# Patient Record
Sex: Male | Born: 1980 | Race: White | Hispanic: No | Marital: Single | Smoking: Current every day smoker
Health system: Southern US, Community
[De-identification: ages and names within clinical notes are randomized; demographics above are authoritative.]

---

## 2016-08-09 ENCOUNTER — Encounter (HOSPITAL_BASED_OUTPATIENT_CLINIC_OR_DEPARTMENT_OTHER): Payer: Self-pay | Admitting: *Deleted

## 2016-08-09 ENCOUNTER — Emergency Department (HOSPITAL_BASED_OUTPATIENT_CLINIC_OR_DEPARTMENT_OTHER): Payer: Self-pay

## 2016-08-09 ENCOUNTER — Emergency Department (HOSPITAL_BASED_OUTPATIENT_CLINIC_OR_DEPARTMENT_OTHER)
Admission: EM | Admit: 2016-08-09 | Discharge: 2016-08-09 | Disposition: A | Discharge status: Home / Self Care | Payer: Self-pay | Attending: Emergency Medicine | Admitting: Emergency Medicine

## 2016-08-09 DIAGNOSIS — K0889 Other specified disorders of teeth and supporting structures: Secondary | ICD-10-CM | POA: Insufficient documentation

## 2016-08-09 DIAGNOSIS — G8929 Other chronic pain: Secondary | ICD-10-CM | POA: Insufficient documentation

## 2016-08-09 DIAGNOSIS — R059 Cough, unspecified: Secondary | ICD-10-CM

## 2016-08-09 DIAGNOSIS — R109 Unspecified abdominal pain: Secondary | ICD-10-CM | POA: Insufficient documentation

## 2016-08-09 DIAGNOSIS — R05 Cough: Secondary | ICD-10-CM

## 2016-08-09 DIAGNOSIS — J0100 Acute maxillary sinusitis, unspecified: Secondary | ICD-10-CM | POA: Insufficient documentation

## 2016-08-09 DIAGNOSIS — F17200 Nicotine dependence, unspecified, uncomplicated: Secondary | ICD-10-CM | POA: Insufficient documentation

## 2016-08-09 DIAGNOSIS — R0602 Shortness of breath: Secondary | ICD-10-CM | POA: Insufficient documentation

## 2016-08-09 DIAGNOSIS — K089 Disorder of teeth and supporting structures, unspecified: Secondary | ICD-10-CM

## 2016-08-09 MED ORDER — CETIRIZINE-PSEUDOEPHEDRINE ER 5-120 MG PO TB12: 1.0000 | ORAL_TABLET | Freq: Two times a day (BID) | ORAL | 0 refills | Status: AC

## 2016-08-09 MED ORDER — IBUPROFEN 600 MG PO TABS
600.0000 mg | ORAL_TABLET | Freq: Four times a day (QID) | ORAL | 0 refills | Status: AC | PRN
Start: 2016-08-09 — End: ?

## 2016-08-09 MED ORDER — AMOXICILLIN-POT CLAVULANATE 875-125 MG PO TABS: 1.0000 | ORAL_TABLET | Freq: Two times a day (BID) | ORAL | 0 refills | Status: AC

## 2016-08-09 MED ORDER — BENZONATATE 100 MG PO CAPS: 100.0000 mg | ORAL_CAPSULE | Freq: Three times a day (TID) | ORAL | 0 refills | Status: AC

## 2016-08-09 NOTE — Discharge Instructions (Signed)
Medications: Augmentin, ibuprofen, Zyrtec-D, Tessalon  Treatment: Take Augmentin twice daily for 7 days. Make sure to finish all this medication. Take ibuprofen every 6 hours as needed for your pain. Take Zyrtec-D twice daily for your nasal congestion. Take Tessalon every 8 hours as needed for cough.  Follow-up: Please follow-up with your primary care provider if your symptoms are not improving over the next 1 week. Please follow-up with a dentist as soon as possible for further evaluation and treatment of your dental pain. In addition to the resources attached, you can also call: [facility] [employer]  [employer]  [employer] [facility]  [address]  [place], [place] [address]  Phone [phone]

## 2016-08-09 NOTE — ED Notes (Signed)
Pt refused discharge vitals 

## 2016-08-09 NOTE — ED Triage Notes (Signed)
States he has a sinus infection and cold. He has been having night sweats and possible fever that has been controlled with Tylenol.

## 2016-08-09 NOTE — ED Provider Notes (Signed)
[facility]   CSN: [id number] Arrival date & time: 08/09/16  1116     History   Chief Complaint No chief complaint on file.   HPI Johnny Campbell is a 35 y.o. male who presents with a three-week history of cough, nasal congestion, facial pain, shortness of breath. Patient reports he has had associated night sweats and fever over the past few nights. Patient reports that his fever improved with Tylenol. He is not taking any other medications for his symptoms. Patient denies any chest pain, new abdominal pain, nausea, vomiting, urinary symptoms. Patient reports he has chronic abdominal pain that is being worked up and he has an endoscopy scheduled soon.  Patient also reports a 6 month history of dental pain. Patient reports a piece of his right upper molar broke off 6 months ago and has had intermittent pain, especially with chewing and getting food stuck in the area. Patient does not have a dentist. Patient has not taken any medicines for this problem.  HPI  History reviewed. No pertinent past medical history.  There are no active problems to display for this patient.   History reviewed. No pertinent surgical history.  OB History    No data available       Home Medications    Prior to Admission medications   Medication Sig Start Date End Date Taking? Authorizing Provider  amoxicillin-clavulanate (AUGMENTIN) 875-125 MG tablet Take 1 tablet by mouth every 12 (twelve) hours. 08/09/16   Chaise Passarella M Nikiya Starn, PA-C  benzonatate (TESSALON) 100 MG capsule Take 1 capsule (100 mg total) by mouth every 8 (eight) hours. 08/09/16   Orine Goga M Tawnia Schirm, PA-C  cetirizine-pseudoephedrine (ZYRTEC-D) 5-120 MG tablet Take 1 tablet by mouth 2 (two) times daily. 08/09/16   Emi Holes, PA-C  ibuprofen (ADVIL,MOTRIN) 600 MG tablet Take 1 tablet (600 mg total) by mouth every 6 (six) hours as needed. 08/09/16   Emi Holes, PA-C    Family History No family history on  file.  Social History Social History  Substance Use Topics  . Smoking status: Current Every Day Smoker  . Smokeless tobacco: Never Used  . Alcohol use No     Allergies   Latex   Review of Systems Review of Systems  Constitutional: Positive for chills and fever (tactile).  HENT: Positive for congestion, dental problem and rhinorrhea. Negative for facial swelling and sore throat.   Respiratory: Positive for cough and shortness of breath.   Cardiovascular: Negative for chest pain.  Gastrointestinal: Positive for abdominal pain (chronic). Negative for nausea and vomiting.  Genitourinary: Negative for dysuria.  Musculoskeletal: Negative for back pain.  Skin: Negative for rash and wound.  Neurological: Negative for headaches.  Psychiatric/Behavioral: The patient is not nervous/anxious.      Physical Exam Updated Vital Signs BP 102/68   Pulse 88   Temp 97.5 F (36.4 C)   Resp 18   Ht 5\' 11"  (1.803 m)   Wt 78.5 kg   SpO2 98%   BMI 24.13 kg/m   Physical Exam  Constitutional: He appears well-developed and well-nourished. No distress.  HENT:  Head: Normocephalic and atraumatic.    Mouth/Throat: Oropharynx is clear and moist and mucous membranes are normal. No trismus in the jaw. No dental abscesses or uvula swelling. No oropharyngeal exudate.    Eyes: Conjunctivae are normal. Pupils are equal, round, and reactive to light. Right eye exhibits no discharge. Left eye exhibits no discharge. No scleral icterus.  Neck: Normal range  of motion. Neck supple. No thyromegaly present.  Cardiovascular: Normal rate, regular rhythm, normal heart sounds and intact distal pulses.  Exam reveals no gallop and no friction rub.   No murmur heard. Pulmonary/Chest: Effort normal and breath sounds normal. No stridor. No respiratory distress. He has no wheezes. He has no rales.  Abdominal: Soft. Bowel sounds are normal. He exhibits no distension. There is tenderness in the right lower quadrant  and left lower quadrant. There is no rebound and no guarding.  Patient reports right and left lower quadrant tenderness is chronic for him and is are being worked up  Musculoskeletal: He exhibits no edema.  Lymphadenopathy:    He has no cervical adenopathy.  Neurological: He is alert. Coordination normal.  Skin: Skin is warm and dry. No rash noted. He is not diaphoretic. No pallor.  Psychiatric: He has a normal mood and affect.  Nursing note and vitals reviewed.    ED Treatments / Results  Labs (all labs ordered are listed, but only abnormal results are displayed) Labs Reviewed - No data to display  EKG  EKG Interpretation None       Radiology Dg Chest 2 View  Result Date: 08/09/2016 CLINICAL DATA:  Cough, congestion. EXAM: CHEST  2 VIEW COMPARISON:  None. FINDINGS: The heart size and mediastinal contours are within normal limits. Both lungs are clear. No pneumothorax or pleural effusion is noted. The visualized skeletal structures are unremarkable. IMPRESSION: No active cardiopulmonary disease. Electronically Signed   By: Lupita RaiderJames  Green Jr, M.D.   On: 08/09/2016 12:40    Procedures Procedures (including critical care time)  Medications Ordered in ED Medications - No data to display   Initial Impression / Assessment and Plan / ED Course  I have reviewed the triage vital signs and the nursing notes.  Pertinent labs & imaging results that were available during my care of the patient were reviewed by me and considered in my medical decision making (see chart for details).  Clinical Course     Chest x-ray shows no active cardiopulmonary disease. Suspect URI with possible sinusitis. We'll treat with Augmentin considering symptoms over 10 days, facial pain, and tenderness. The Augmentin will also cover any dental infection that could be present, however no obvious gingivitis or abscess. Discharge home with symptomatic treatment as well including Zyrtec-D, Tessalon, ibuprofen.  Patient encouraged to follow up with PCP if symptoms are not improving in 4-5 days. Patient also encouraged to follow-up with dentist as soon as possible; given dental resources. Return precautions discussed. Patient understands and agrees with plan. Patient vitals stable throughout ED course and discharged in satisfactory condition.  Final Clinical Impressions(s) / ED Diagnoses   Final diagnoses:  Cough  Acute maxillary sinusitis, recurrence not specified  Chronic dental pain    New Prescriptions Discharge Medication List as of 08/09/2016  1:02 PM    START taking these medications   Details  amoxicillin-clavulanate (AUGMENTIN) 875-125 MG tablet Take 1 tablet by mouth every 12 (twelve) hours., Starting Thu 08/09/2016, Print    benzonatate (TESSALON) 100 MG capsule Take 1 capsule (100 mg total) by mouth every 8 (eight) hours., Starting Thu 08/09/2016, Print    cetirizine-pseudoephedrine (ZYRTEC-D) 5-120 MG tablet Take 1 tablet by mouth 2 (two) times daily., Starting Thu 08/09/2016, Print    ibuprofen (ADVIL,MOTRIN) 600 MG tablet Take 1 tablet (600 mg total) by mouth every 6 (six) hours as needed., Starting Thu 08/09/2016, Print         Emi HolesAlexandra M Anahi Belmar,  PA-C 08/09/16 1329    Charlynne Panderavid Hsienta Yao, MD 08/09/16 [id number]

## 2018-06-18 IMAGING — DX DG CHEST 2V
2 series · 2 of 2 positions shown · non-contrast
Comparison: None.

CLINICAL DATA: Cough, congestion.

EXAM:
CHEST  2 VIEW

[chest pa]
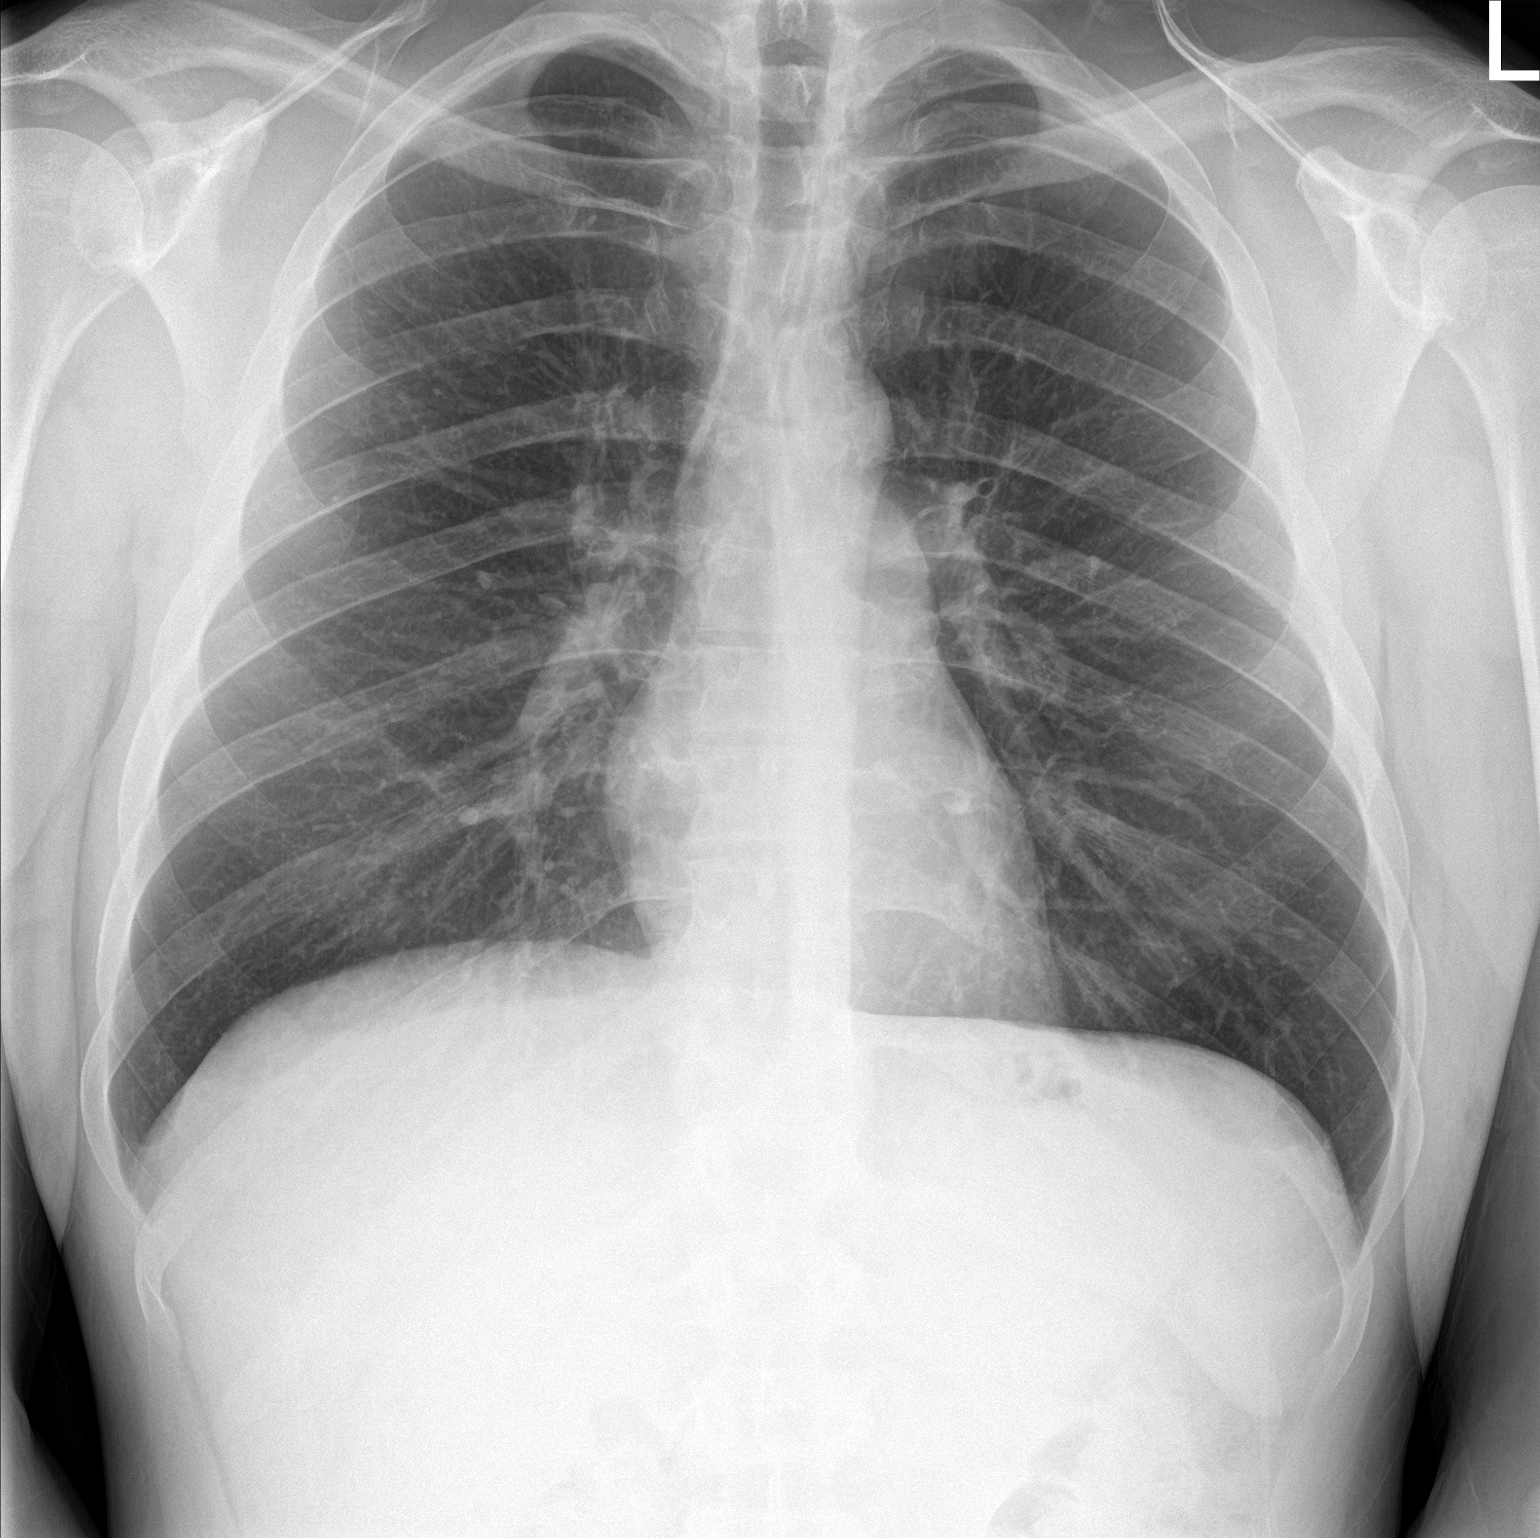

[chest lat]
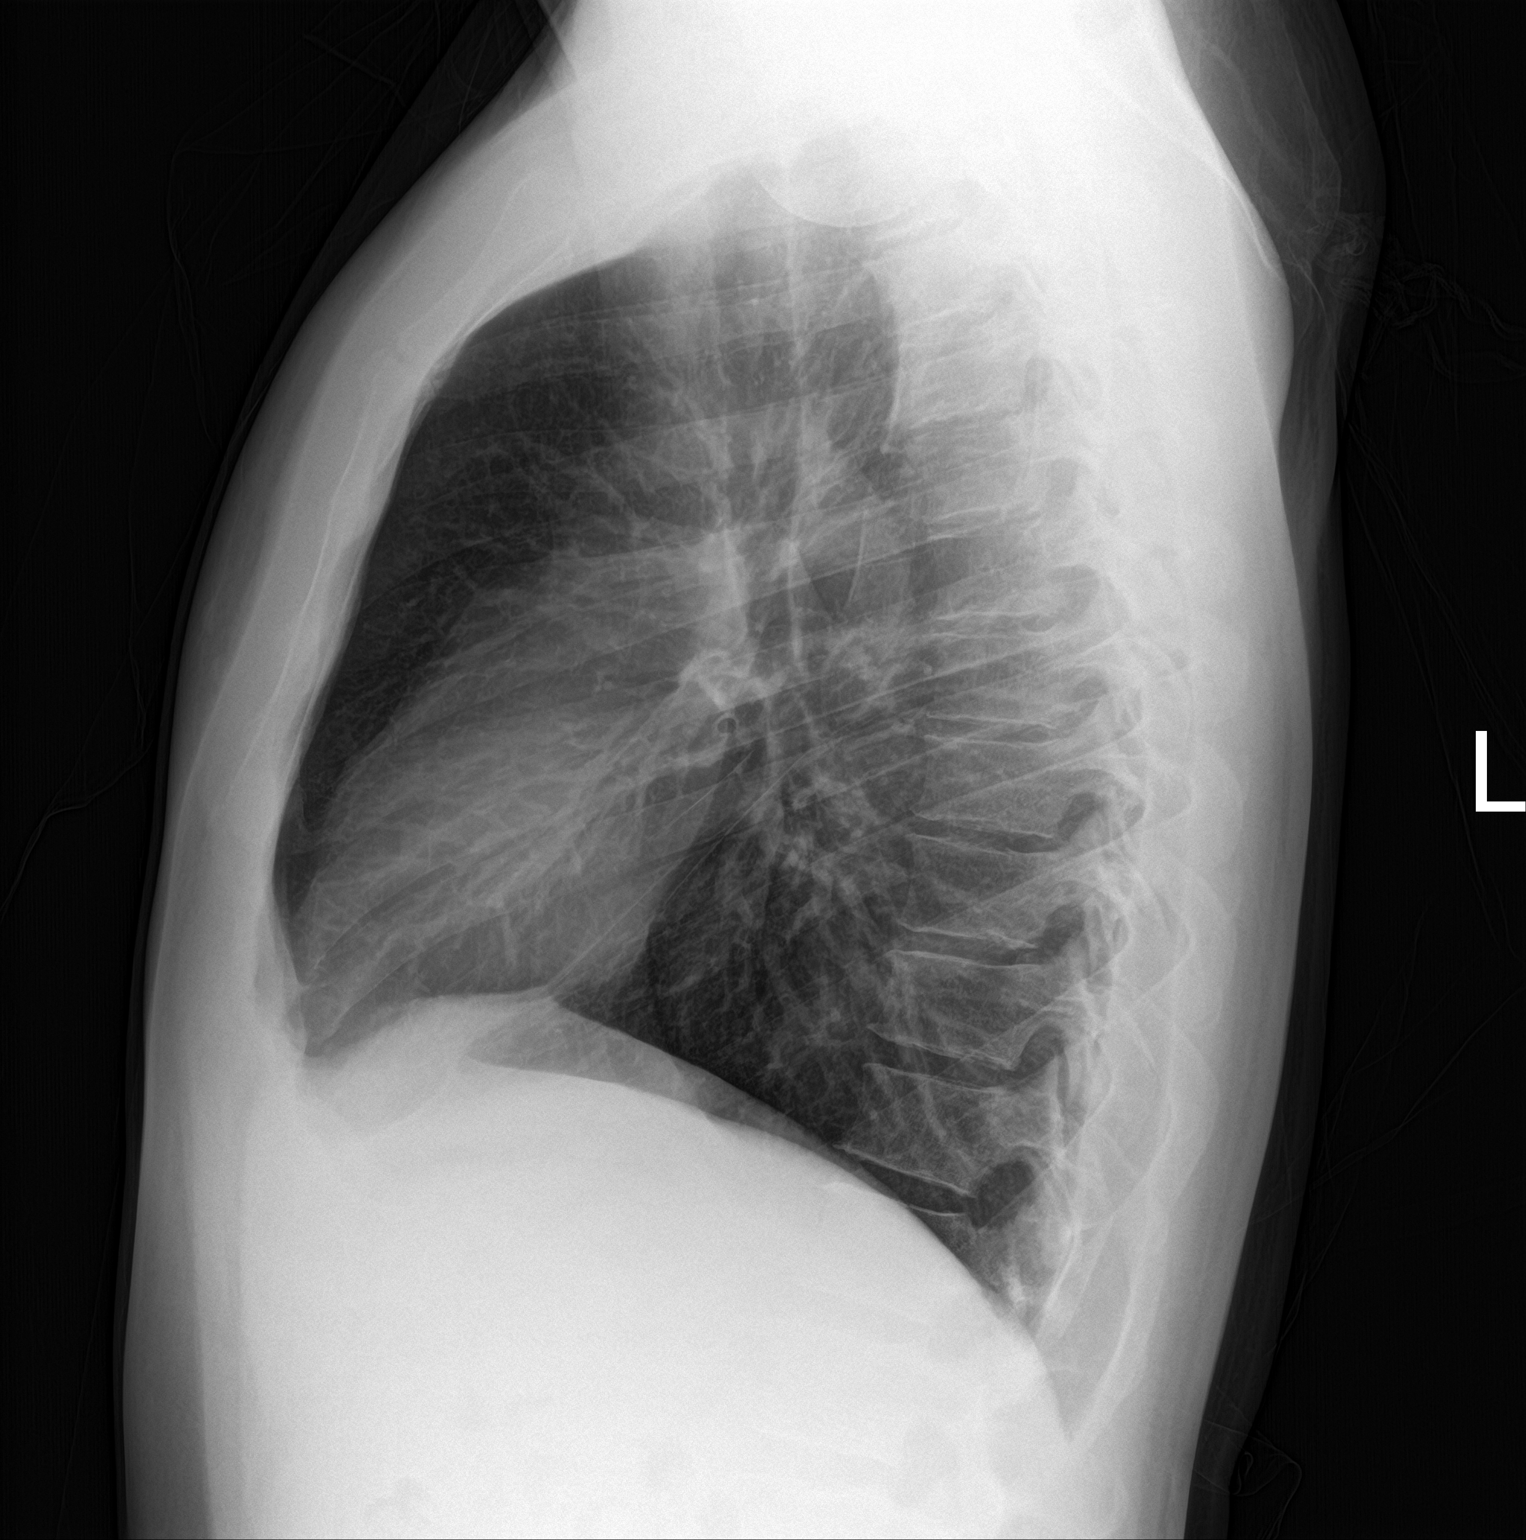

[2 of 2 positions shown; findings below may reference images not displayed]

FINDINGS: The heart size and mediastinal contours are within normal limits.
Both lungs are clear. No pneumothorax or pleural effusion is noted.
The visualized skeletal structures are unremarkable.
IMPRESSION: No active cardiopulmonary disease.
